# Patient Record
Sex: Female | Born: 1990 | Race: Black or African American | Hispanic: No | Marital: Married | Smoking: Never smoker
Health system: Southern US, Community
[De-identification: ages and names within clinical notes are randomized; demographics above are authoritative.]

---

## 2021-01-16 ENCOUNTER — Emergency Department: Payer: Managed Care, Other (non HMO)

## 2021-01-16 ENCOUNTER — Encounter: Payer: Self-pay | Admitting: Emergency Medicine

## 2021-01-16 ENCOUNTER — Emergency Department
Admission: EM | Admit: 2021-01-16 | Discharge: 2021-01-16 | Disposition: A | Discharge status: Home / Self Care | Payer: Managed Care, Other (non HMO) | Attending: Emergency Medicine | Admitting: Emergency Medicine

## 2021-01-16 ENCOUNTER — Other Ambulatory Visit: Payer: Self-pay

## 2021-01-16 DIAGNOSIS — Y92410 Unspecified street and highway as the place of occurrence of the external cause: Secondary | ICD-10-CM | POA: Insufficient documentation

## 2021-01-16 DIAGNOSIS — S8011XA Contusion of right lower leg, initial encounter: Secondary | ICD-10-CM | POA: Insufficient documentation

## 2021-01-16 DIAGNOSIS — S8012XA Contusion of left lower leg, initial encounter: Secondary | ICD-10-CM | POA: Insufficient documentation

## 2021-01-16 DIAGNOSIS — S8991XA Unspecified injury of right lower leg, initial encounter: Secondary | ICD-10-CM | POA: Diagnosis present

## 2021-01-16 MED ORDER — HYDROCODONE-ACETAMINOPHEN 5-325 MG PO TABS: 1.0000 | ORAL_TABLET | Freq: Four times a day (QID) | ORAL | 0 refills | Status: AC | PRN

## 2021-01-16 MED ORDER — OXYCODONE-ACETAMINOPHEN 5-325 MG PO TABS
1.0000 | ORAL_TABLET | Freq: Once | ORAL | Status: AC
  Administered 2021-01-16: 1 via ORAL
  Filled 2021-01-16: qty 1
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
## 2021-01-16 NOTE — Discharge Instructions (Signed)
Follow-up with your primary care provider if any continued problems or concerns.  Also today ice and elevate your left leg as needed for swelling and discomfort.  A prescription for hydrocodone with acetaminophen was sent to your pharmacy.  This medication is 1 every 6 hours as needed for pain.  You may also take ibuprofen with this medication but additional acetaminophen is not needed as it is already in the pain medication.  You will be sore for approximately 4 to 5 days and also have muscle soreness in places that are not bothering you at this time.
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
